# Patient Record
Sex: Male | Born: 1980 | Race: White | Hispanic: No | Marital: Married | Smoking: Former smoker
Health system: Southern US, Community
[De-identification: ages and names within clinical notes are randomized; demographics above are authoritative.]

## PROBLEM LIST (undated history)

## (undated) DIAGNOSIS — E119 Type 2 diabetes mellitus without complications: Secondary | ICD-10-CM

## (undated) DIAGNOSIS — E782 Mixed hyperlipidemia: Secondary | ICD-10-CM

## (undated) HISTORY — PX: KNEE ARTHROSCOPY: SUR90

## (undated) HISTORY — DX: Type 2 diabetes mellitus without complications: E11.9

## (undated) HISTORY — DX: Mixed hyperlipidemia: E78.2

---

## 2006-01-09 ENCOUNTER — Ambulatory Visit: Payer: Self-pay | Admitting: Nurse Practitioner

## 2006-01-26 ENCOUNTER — Ambulatory Visit: Payer: Self-pay | Admitting: Nurse Practitioner

## 2011-02-07 ENCOUNTER — Emergency Department (HOSPITAL_COMMUNITY)
Admission: EM | Admit: 2011-02-07 | Discharge: 2011-02-08 | Disposition: A | Discharge status: Home / Self Care | Payer: Self-pay | Attending: Emergency Medicine | Admitting: Emergency Medicine

## 2011-02-07 ENCOUNTER — Emergency Department (HOSPITAL_COMMUNITY): Payer: Self-pay

## 2011-02-07 DIAGNOSIS — E119 Type 2 diabetes mellitus without complications: Secondary | ICD-10-CM | POA: Insufficient documentation

## 2011-02-07 DIAGNOSIS — R5381 Other malaise: Secondary | ICD-10-CM | POA: Insufficient documentation

## 2011-02-07 DIAGNOSIS — I10 Essential (primary) hypertension: Secondary | ICD-10-CM | POA: Insufficient documentation

## 2011-02-07 DIAGNOSIS — J189 Pneumonia, unspecified organism: Secondary | ICD-10-CM | POA: Insufficient documentation

## 2011-02-07 DIAGNOSIS — R509 Fever, unspecified: Secondary | ICD-10-CM | POA: Insufficient documentation

## 2011-02-07 DIAGNOSIS — E7800 Pure hypercholesterolemia, unspecified: Secondary | ICD-10-CM | POA: Insufficient documentation

## 2011-02-07 DIAGNOSIS — R05 Cough: Secondary | ICD-10-CM | POA: Insufficient documentation

## 2011-02-07 DIAGNOSIS — Z79899 Other long term (current) drug therapy: Secondary | ICD-10-CM | POA: Insufficient documentation

## 2011-02-07 DIAGNOSIS — R059 Cough, unspecified: Secondary | ICD-10-CM | POA: Insufficient documentation

## 2011-02-07 LAB — COMPREHENSIVE METABOLIC PANEL WITH GFR
ALT: 52 U/L (ref 0–53)
AST: 42 U/L — ABNORMAL HIGH (ref 0–37)
Albumin: 3.7 g/dL (ref 3.5–5.2)
Alkaline Phosphatase: 63 U/L (ref 39–117)
BUN: 10 mg/dL (ref 6–23)
CO2: 23 meq/L (ref 19–32)
Calcium: 8.7 mg/dL (ref 8.4–10.5)
Chloride: 100 meq/L (ref 96–112)
Creatinine, Ser: 1.33 mg/dL (ref 0.4–1.5)
GFR calc non Af Amer: >60 mL/min
Glucose, Bld: 208 mg/dL — ABNORMAL HIGH (ref 70–99)
Potassium: 3.3 meq/L — ABNORMAL LOW (ref 3.5–5.1)
Sodium: 132 meq/L — ABNORMAL LOW (ref 135–145)
Total Bilirubin: 2.5 mg/dL — ABNORMAL HIGH (ref 0.3–1.2)
Total Protein: 7.6 g/dL (ref 6.0–8.3)

## 2011-02-07 LAB — URINALYSIS, ROUTINE W REFLEX MICROSCOPIC
Leukocytes, UA: NEGATIVE
Nitrite: NEGATIVE
Protein, ur: 100 mg/dL — AB
Specific Gravity, Urine: 1.02 (ref 1.005–1.030)
Urine Glucose, Fasting: NEGATIVE mg/dL
Urobilinogen, UA: 0.2 mg/dL (ref 0.0–1.0)
pH: 6 (ref 5.0–8.0)

## 2011-02-07 LAB — CBC
HCT: 40.3 % (ref 39.0–52.0)
Hemoglobin: 14.1 g/dL (ref 13.0–17.0)
MCH: 29 pg (ref 26.0–34.0)
MCHC: 35 g/dL (ref 30.0–36.0)
MCV: 82.9 fL (ref 78.0–100.0)
Platelets: 173 10*3/uL (ref 150–400)
RBC: 4.86 MIL/uL (ref 4.22–5.81)
RDW: 13.1 % (ref 11.5–15.5)
WBC: 12.7 10*3/uL — ABNORMAL HIGH (ref 4.0–10.5)

## 2011-02-07 LAB — URINE MICROSCOPIC-ADD ON

## 2011-02-07 LAB — DIFFERENTIAL
Basophils Relative: 0 % (ref 0–1)
Lymphs Abs: 1.9 10*3/uL (ref 0.7–4.0)
Monocytes Absolute: 1.2 10*3/uL — ABNORMAL HIGH (ref 0.1–1.0)
Monocytes Relative: 10 % (ref 3–12)
Neutro Abs: 9.4 10*3/uL — ABNORMAL HIGH (ref 1.7–7.7)
Neutrophils Relative %: 75 % (ref 43–77)

## 2011-02-12 LAB — CULTURE, BLOOD (ROUTINE X 2)

## 2021-08-14 ENCOUNTER — Other Ambulatory Visit: Payer: Self-pay | Admitting: Neurosurgery

## 2021-08-14 DIAGNOSIS — M5440 Lumbago with sciatica, unspecified side: Secondary | ICD-10-CM

## 2021-08-31 ENCOUNTER — Ambulatory Visit
Admission: RE | Admit: 2021-08-31 | Discharge: 2021-08-31 | Disposition: A | Discharge status: Home / Self Care | Payer: 59 | Source: Ambulatory Visit | Attending: Neurosurgery | Admitting: Neurosurgery

## 2021-08-31 ENCOUNTER — Other Ambulatory Visit: Payer: Self-pay

## 2021-08-31 DIAGNOSIS — M5440 Lumbago with sciatica, unspecified side: Secondary | ICD-10-CM

## 2021-11-26 ENCOUNTER — Other Ambulatory Visit: Payer: Self-pay | Admitting: Student

## 2021-11-26 DIAGNOSIS — M5440 Lumbago with sciatica, unspecified side: Secondary | ICD-10-CM

## 2021-11-30 ENCOUNTER — Ambulatory Visit
Admission: RE | Admit: 2021-11-30 | Discharge: 2021-11-30 | Disposition: A | Discharge status: Home / Self Care | Payer: 59 | Source: Ambulatory Visit | Attending: Student | Admitting: Student

## 2021-11-30 ENCOUNTER — Other Ambulatory Visit: Payer: Self-pay

## 2021-11-30 DIAGNOSIS — M5440 Lumbago with sciatica, unspecified side: Secondary | ICD-10-CM

## 2021-11-30 MED ORDER — IOPAMIDOL (ISOVUE-M 300) INJECTION 61%
1.0000 mL | Freq: Once | INTRAMUSCULAR | Status: AC | PRN
  Administered 2021-11-30: 1 mL via EPIDURAL

## 2021-11-30 MED ORDER — METHYLPREDNISOLONE ACETATE 40 MG/ML INJ SUSP (RADIOLOG
80.0000 mg | Freq: Once | INTRAMUSCULAR | Status: AC
  Administered 2021-11-30: 80 mg via EPIDURAL

## 2021-11-30 NOTE — Discharge Instructions (Signed)

## 2022-08-21 IMAGING — XA Imaging study
2 series · 2 of 2 positions shown · non-contrast
Comparison: none

CLINICAL DATA: 40-year-old male with low back pain and right lower
extremity sciatica.

[Series 1: ortho standard · 1 of 1 slices shown (1 of 2)]
[im 1/1]
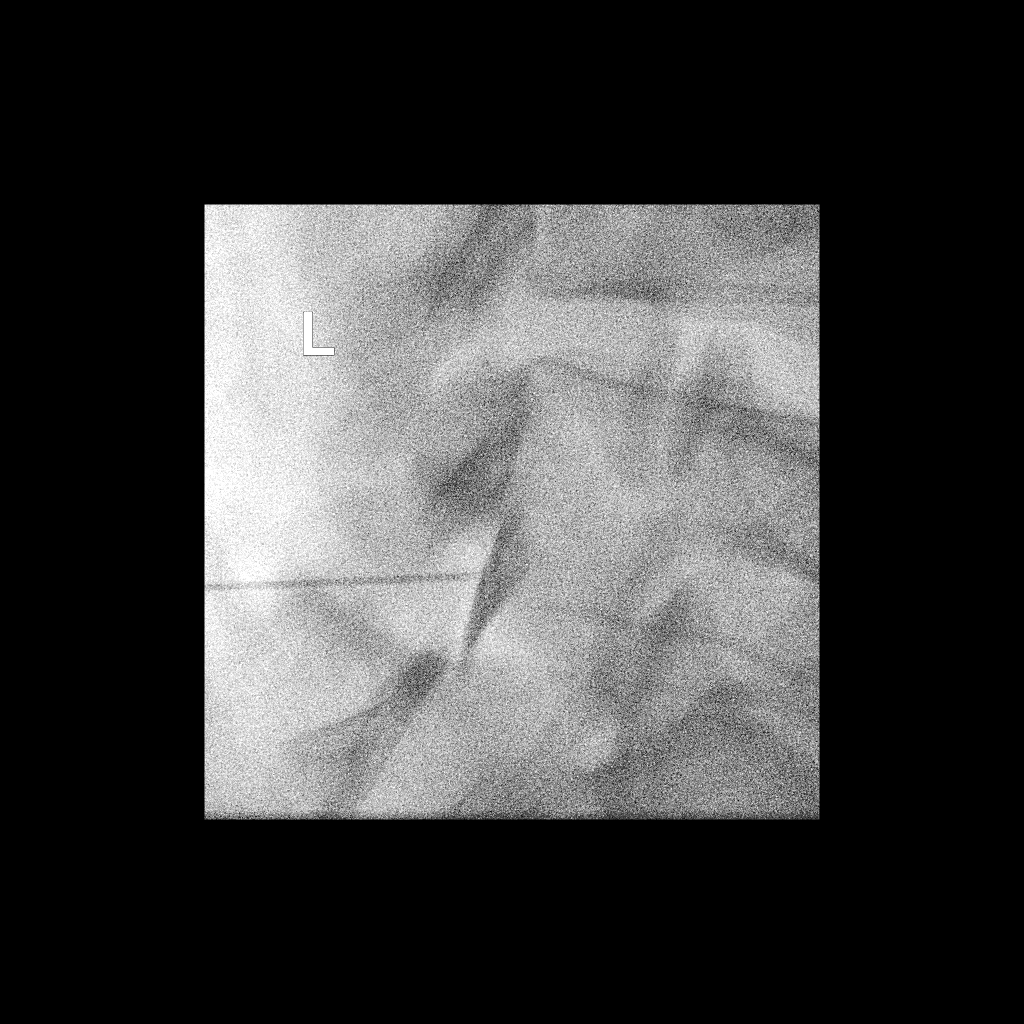

[Series 2: ortho standard · 1 of 1 slices shown (2 of 2)]
[im 1/1]
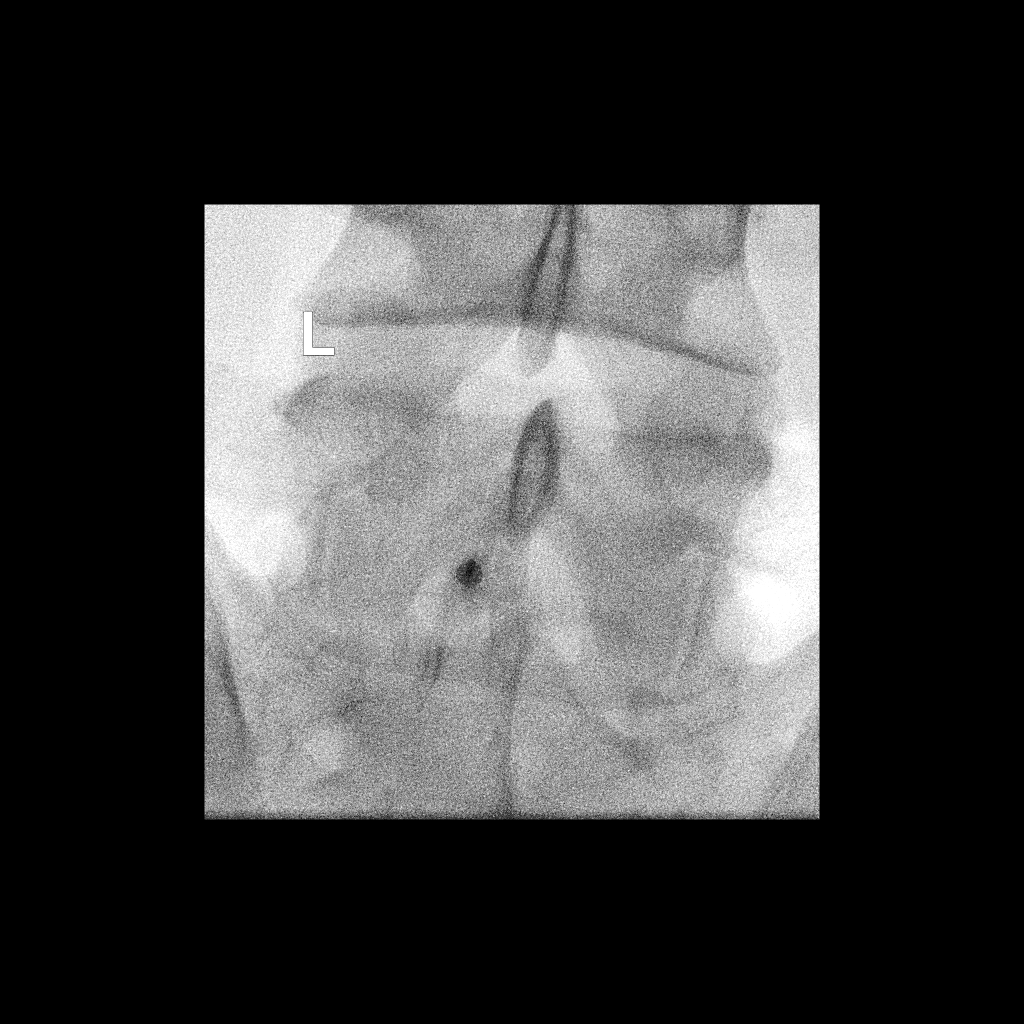

[2 of 2 positions shown; findings below may reference images not displayed]

FLUOROSCOPY TIME:  0 minutes 23 seconds 2.9 mGy

PROCEDURE:
The procedure, risks, benefits, and alternatives were explained to
the patient. Questions regarding the procedure were encouraged and
answered. The patient understands and consents to the procedure.

LUMBAR EPIDURAL INJECTION:

An interlaminar approach was performed on left at L5-S1. The
overlying skin was cleansed and anesthetized. A 20 gauge epidural
needle was advanced using loss-of-resistance technique.

DIAGNOSTIC EPIDURAL INJECTION:

Injection of Isovue-M 200 shows a good epidural pattern with spread
above and below the level of needle placement, primarily on the left
no vascular opacification is seen.

THERAPEUTIC EPIDURAL INJECTION:

40 mg of Depo-Medrol mixed with 2 mL 1% lidocaine were instilled.
The procedure was well-tolerated, and the patient was discharged
thirty minutes following the injection in good condition.

COMPLICATIONS:
None
IMPRESSION: Technically successful epidural injection on the left L5-S1 #1.

## 2022-11-01 ENCOUNTER — Ambulatory Visit
Admission: EM | Admit: 2022-11-01 | Discharge: 2022-11-01 | Disposition: A | Discharge status: Home / Self Care | Payer: 59

## 2022-11-01 DIAGNOSIS — J029 Acute pharyngitis, unspecified: Secondary | ICD-10-CM

## 2022-11-01 DIAGNOSIS — J3489 Other specified disorders of nose and nasal sinuses: Secondary | ICD-10-CM

## 2022-11-01 LAB — POCT RAPID STREP A (OFFICE): Rapid Strep A Screen: NEGATIVE

## 2022-11-01 MED ORDER — PREDNISONE 50 MG PO TABS: ORAL_TABLET | ORAL | 0 refills | Status: AC

## 2022-11-01 MED ORDER — AZELASTINE HCL 0.1 % NA SOLN: 1.0000 | Freq: Two times a day (BID) | NASAL | 2 refills | Status: AC

## 2022-11-01 NOTE — ED Triage Notes (Signed)
Pt states post nasal drip that is making his throat hurt states he was taking tylenol sinus with no relief.

## 2022-11-01 NOTE — Discharge Instructions (Addendum)
I do recommend taking a Zyrtec daily to help cover for seasonal allergies, I have sent over a drying nasal spray to help with your postnasal drip and a short course of prednisone.  You may also take Coricidin HBP as needed until your symptoms improve.

## 2022-11-01 NOTE — ED Provider Notes (Signed)
[facility]    CSN: [id number] Arrival date & time: 11/01/22  1103      History   Chief Complaint Chief Complaint  Patient presents with   Sore Throat    HPI NAVI ERBER is a 41 y.o. male.   Patient presenting today with several day history of copious postnasal drip, sore scratchy throat, ear pressure, sinus pressure.  Minimal cough, mostly with laying down.  Denies fever, chills, chest pain, shortness of breath, abdominal pain, nausea vomiting or diarrhea.  No known sick contacts recently.  Taking Tylenol Cold and sinus with mild temporary relief of symptoms.  States he does have an occasional issue with seasonal allergies but does not take anything for this.    Past Medical History:  Diagnosis Date   Diabetes mellitus without complication (HCC)    Elevated triglycerides with high cholesterol     There are no problems to display for this patient.   Past Surgical History:  Procedure Laterality Date   KNEE ARTHROSCOPY Left        Home Medications    Prior to Admission medications   Medication Sig Start Date End Date Taking? Authorizing Provider  atorvastatin (LIPITOR) 40 MG tablet  05/19/17  Yes [provider]  azelastine (ASTELIN) 0.1 % nasal spray Place 1 spray into both nostrils 2 (two) times daily. Use in each nostril as directed 11/01/22  Yes Particia Nearing, PA-C  glipiZIDE (GLUCOTROL) 10 MG tablet  05/19/17  Yes [provider]  metFORMIN (GLUCOPHAGE-XR) 500 MG 24 hr tablet Take by mouth. 09/23/22  Yes [provider]  predniSONE (DELTASONE) 50 MG tablet Take 1 tab daily with food, preferably with breakfast 11/01/22  Yes Particia Nearing, PA-C  fenofibrate (TRICOR) 145 MG tablet Take 145 mg by mouth daily.    [provider]    Family History History reviewed. No pertinent family history.  Social History Social History   Tobacco Use   Smoking status: Former    Types: Cigarettes    Passive  exposure: Never   Smokeless tobacco: Never  Vaping Use   Vaping Use: Never used  Substance Use Topics   Alcohol use: Not Currently   Drug use: Never     Allergies   Amoxicillin   Review of Systems Review of Systems Per HPI  Physical Exam Triage Vital Signs ED Triage Vitals [11/01/22 1223]  Enc Vitals Group     BP (!) 135/95     Pulse Rate (!) 109     Resp 16     Temp 98.4 F (36.9 C)     Temp Source Oral     SpO2 97 %     Weight 280 lb (127 kg)     Height 6\' 5"  (1.956 m)     Head Circumference      Peak Flow      Pain Score 7     Pain Loc      Pain Edu?      Excl. in GC?    No data found.  Updated Vital Signs BP (!) 135/95 (BP Location: Right Arm)   Pulse (!) 109   Temp 98.4 F (36.9 C) (Oral)   Resp 16   Ht 6\' 5"  (1.956 m)   Wt 280 lb (127 kg)   SpO2 97%   BMI 33.20 kg/m   Visual Acuity Right Eye Distance:   Left Eye Distance:   Bilateral Distance:    Right Eye Near:  Left Eye Near:    Bilateral Near:     Physical Exam Vitals and nursing note reviewed.  Constitutional:      Appearance: He is well-developed.  HENT:     Head: Atraumatic.     Right Ear: External ear normal.     Left Ear: External ear normal.     Nose: Rhinorrhea present.     Mouth/Throat:     Pharynx: Posterior oropharyngeal erythema present. No oropharyngeal exudate.     Comments: No tonsillar edema, exudates.  Uvula midline, oral airway patent Eyes:     Conjunctiva/sclera: Conjunctivae normal.     Pupils: Pupils are equal, round, and reactive to light.  Cardiovascular:     Rate and Rhythm: Normal rate and regular rhythm.  Pulmonary:     Effort: Pulmonary effort is normal. No respiratory distress.     Breath sounds: No wheezing or rales.  Musculoskeletal:        General: Normal range of motion.     Cervical back: Normal range of motion and neck supple.  Lymphadenopathy:     Cervical: No cervical adenopathy.  Skin:    General: Skin is warm and dry.  Neurological:      Mental Status: He is alert and oriented to person, place, and time.  Psychiatric:        Behavior: Behavior normal.      UC Treatments / Results  Labs (all labs ordered are listed, but only abnormal results are displayed) Labs Reviewed  POCT RAPID STREP A (OFFICE)    EKG   Radiology No results found.  Procedures Procedures (including critical care time)  Medications Ordered in UC Medications - No data to display  Initial Impression / Assessment and Plan / UC Course  I have reviewed the triage vital signs and the nursing notes.  Pertinent labs & imaging results that were available during my care of the patient were reviewed by me and considered in my medical decision making (see chart for details).     Rapid strep negative, vital signs overall reassuring today, suspect uncontrolled seasonal allergies causing some pharyngitis.  Treat with short course of prednisone, Astelin nasal spray for postnasal drip and salt water gargles, throat lozenges and throat sprays.  Start allergy regimen with Zyrtec and Flonase daily.  Return for worsening symptoms.  Viral testing deferred with shared decision making today.  Final Clinical Impressions(s) / UC Diagnoses   Final diagnoses:  Sinus drainage  Acute pharyngitis, unspecified etiology     Discharge Instructions      I do recommend taking a Zyrtec daily to help cover for seasonal allergies, I have sent over a drying nasal spray to help with your postnasal drip and a short course of prednisone.  You may also take Coricidin HBP as needed until your symptoms improve.    ED Prescriptions     Medication Sig Dispense Auth. Provider   predniSONE (DELTASONE) 50 MG tablet Take 1 tab daily with food, preferably with breakfast 3 tablet Particia Nearing, PA-C   azelastine (ASTELIN) 0.1 % nasal spray Place 1 spray into both nostrils 2 (two) times daily. Use in each nostril as directed 30 mL Particia Nearing, PA-C       PDMP not reviewed this encounter.   Particia Nearing, [place] 11/01/22 1256
